# Patient Record
Sex: Female | Born: 1960 | Race: White | Hispanic: No | Smoking: Never smoker
Health system: Southern US, Community
[De-identification: ages and names within clinical notes are randomized; demographics above are authoritative.]

## PROBLEM LIST (undated history)

## (undated) DIAGNOSIS — I10 Essential (primary) hypertension: Secondary | ICD-10-CM

## (undated) HISTORY — DX: Essential (primary) hypertension: I10

## (undated) HISTORY — PX: HERNIA REPAIR: SHX51

---

## 2001-03-06 ENCOUNTER — Ambulatory Visit (HOSPITAL_COMMUNITY): Admission: RE | Admit: 2001-03-06 | Discharge: 2001-03-06 | Payer: Self-pay | Admitting: *Deleted

## 2005-09-13 ENCOUNTER — Encounter: Admission: RE | Admit: 2005-09-13 | Discharge: 2005-09-13 | Payer: Self-pay | Admitting: Obstetrics & Gynecology

## 2005-09-24 ENCOUNTER — Emergency Department (HOSPITAL_COMMUNITY): Admission: EM | Admit: 2005-09-24 | Discharge: 2005-09-24 | Payer: Self-pay | Admitting: Emergency Medicine

## 2006-11-05 IMAGING — US US TRANSVAGINAL NON-OB
1 series · 14 of 25 positions shown · non-contrast
Comparison: none

CLINICAL DATA: Dysmenorrhea and menorrhagia.
 TRANSABDOMINAL AND TRANSVAGINAL PELVIC ULTRASOUND:
TECHNIQUE: Both transabdominal and transvaginal ultrasound examinations of the pelvis were performed, including evaluation of the uterus, ovaries, adnexal regions, and pelvic cul-de-sac.
 The uterus measures 9.9 x 4.5 x 5.7 cm.  Normal endometrial stripe measuring 6.8 mm.  No myometrial abnormalities are seen.  The right ovary measures 4.2 x 1.7 x 2.3 cm.  The left ovary measures 3.1 x 1.7 x 1.7 cm.  No free pelvic fluid collections are seen.

[Series 1: unknown · 0.24mm/px · 14 of 45 slices shown]
[im 1/45]
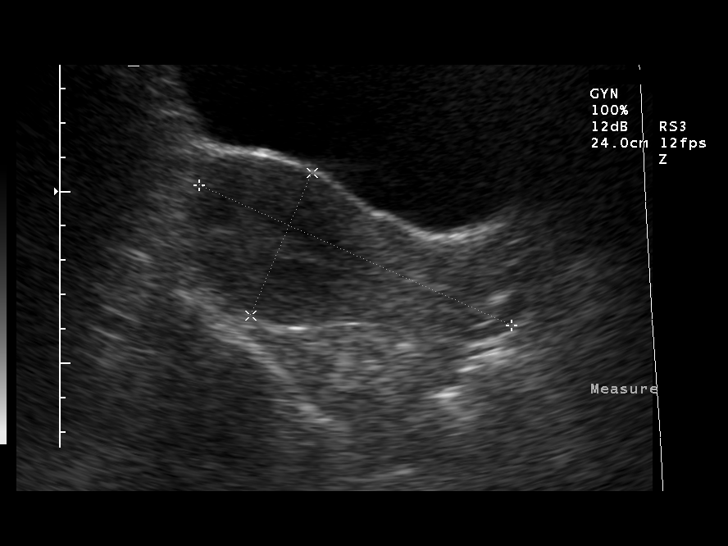
[im 4/45]
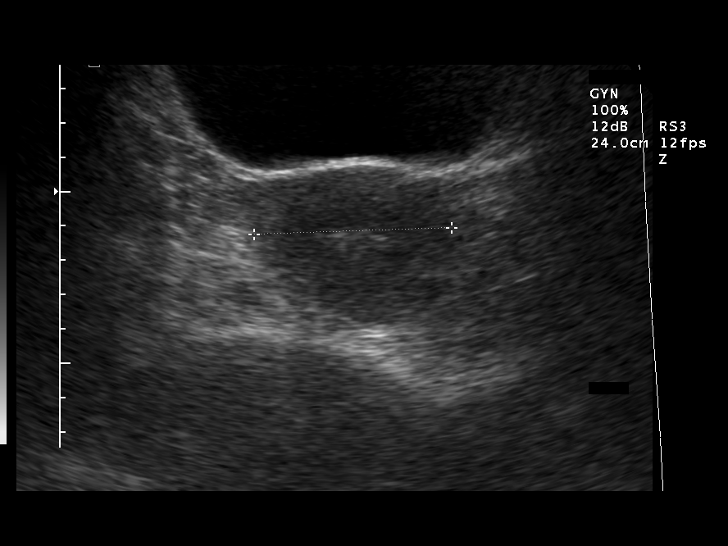
[im 8/45]
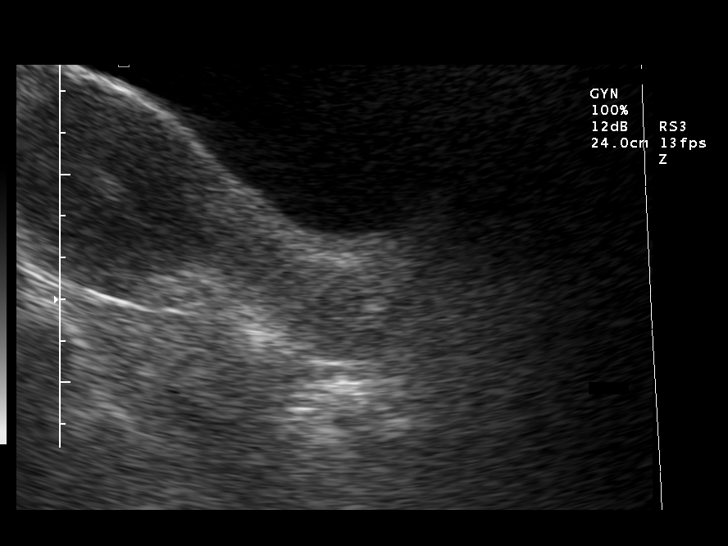
[im 12/45]
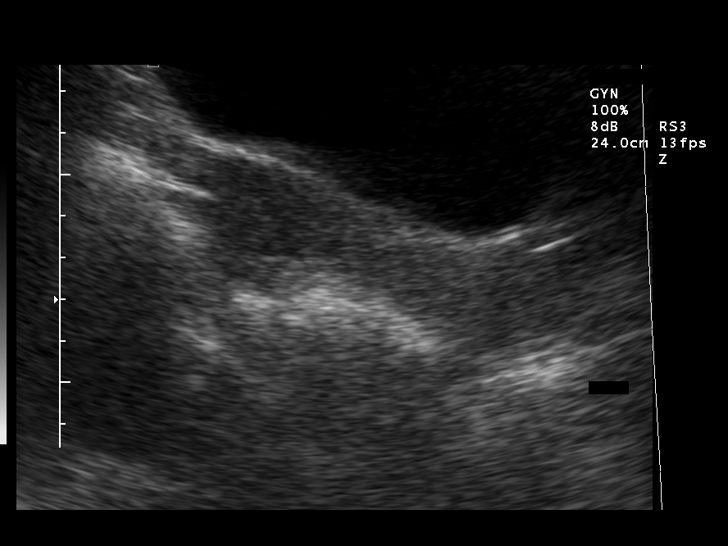
[im 15/45]
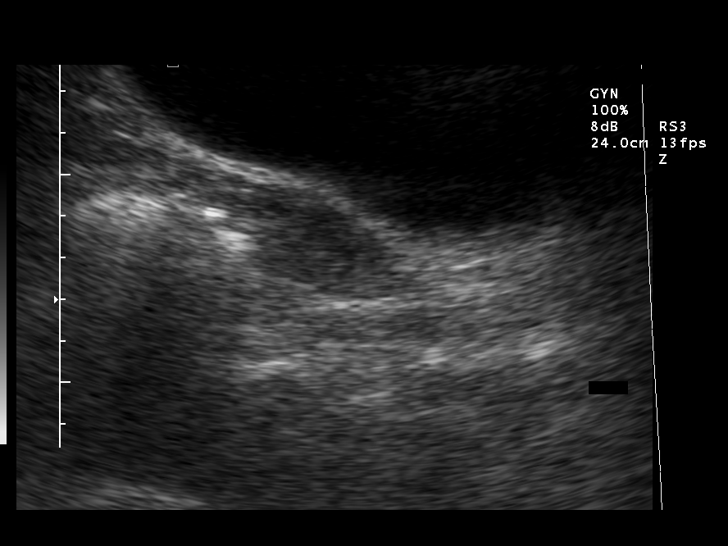
[im 17/45]
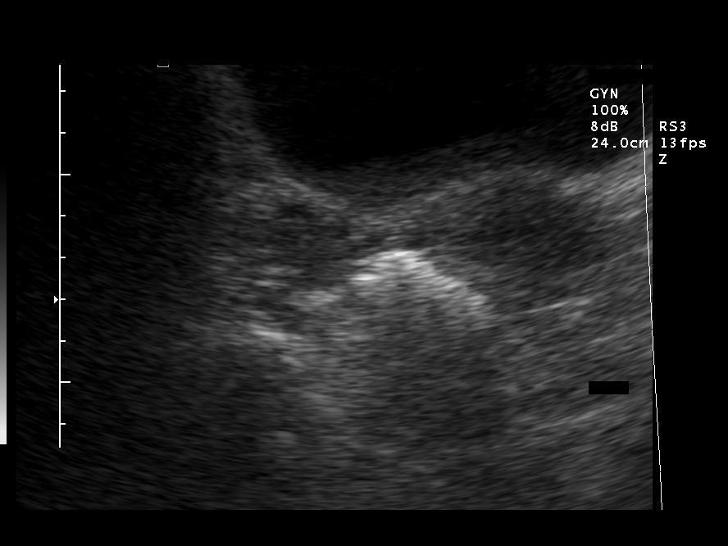
[im 21/45]
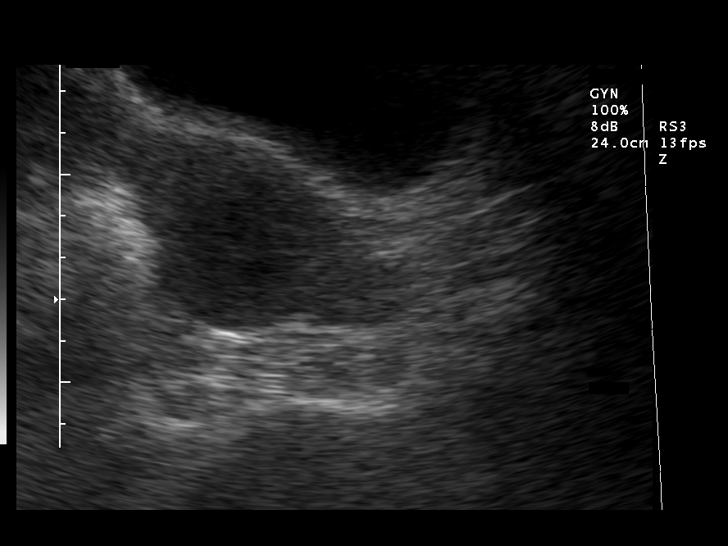
[im 24/45]
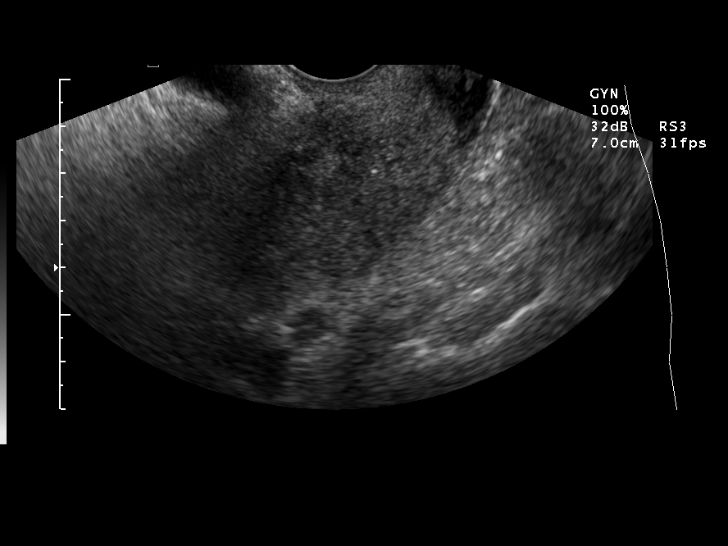
[im 28/45]
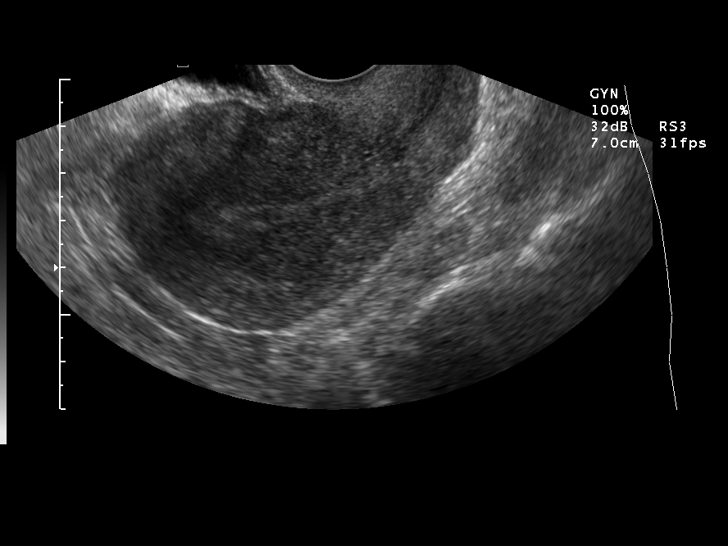
[im 30/45]
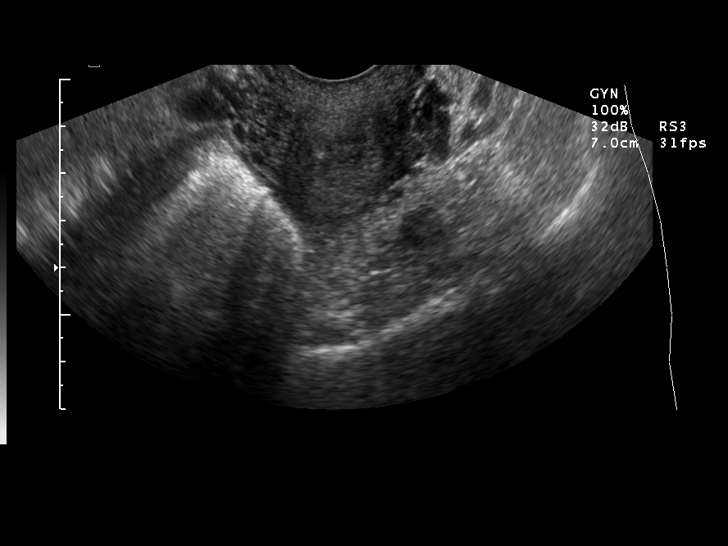
[im 34/45]
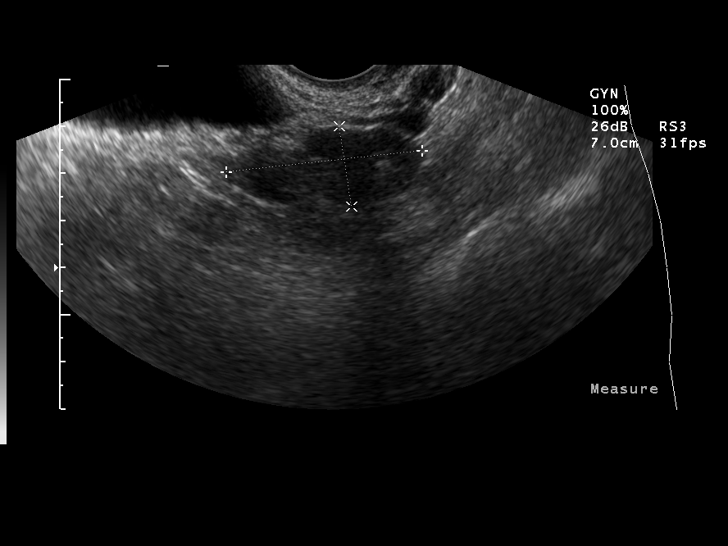
[im 37/45]
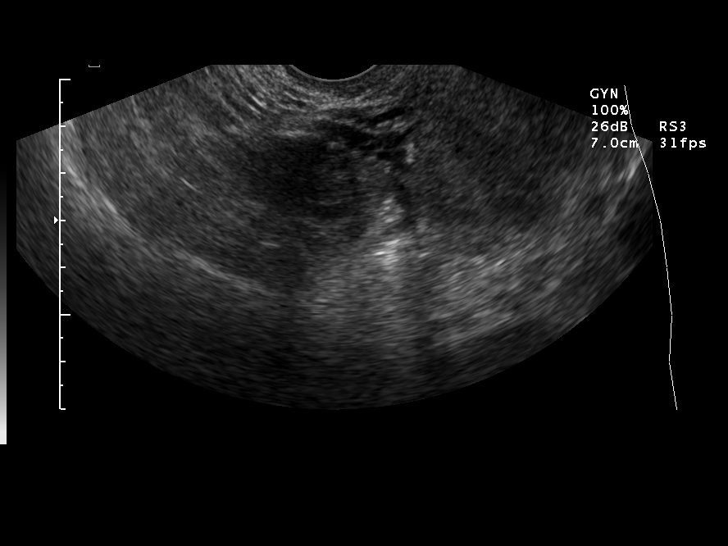
[im 41/45]
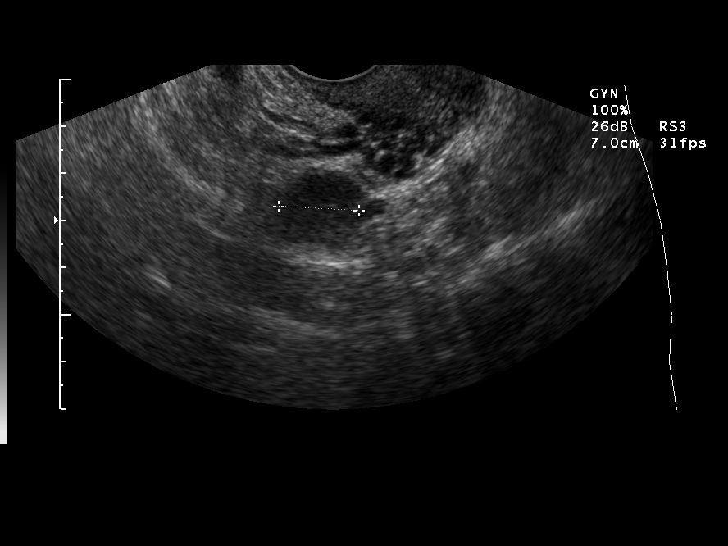
[im 45/45]
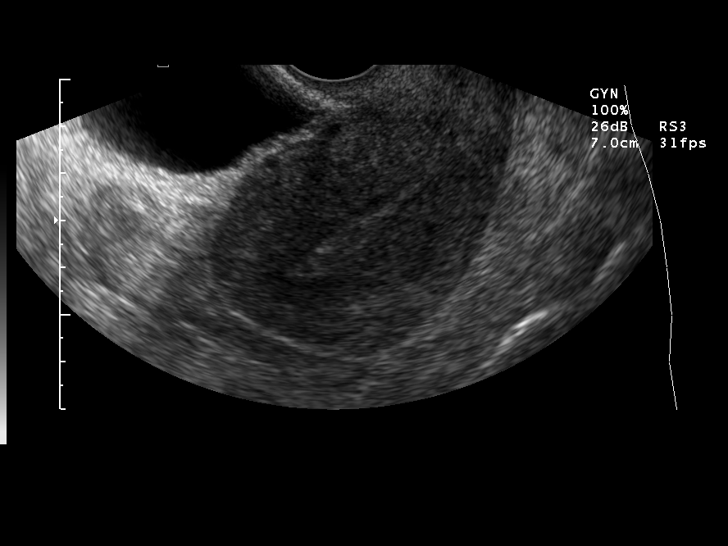

[14 of 25 positions shown; findings below may reference images not displayed]

IMPRESSION: 1.  Unremarkable pelvic ultrasound examination.

## 2012-09-18 ENCOUNTER — Ambulatory Visit: Payer: Self-pay | Admitting: Emergency Medicine

## 2012-09-18 VITALS — BP 128/86 | HR 83 | Temp 97.6°F | Resp 18 | Ht 63.25 in | Wt 186.0 lb

## 2012-09-18 DIAGNOSIS — I10 Essential (primary) hypertension: Secondary | ICD-10-CM | POA: Insufficient documentation

## 2012-09-18 MED ORDER — LISINOPRIL 20 MG PO TABS: 20.0000 mg | ORAL_TABLET | Freq: Every day | ORAL | Status: DC

## 2012-09-18 MED ORDER — HYDROCHLOROTHIAZIDE 25 MG PO TABS: 25.0000 mg | ORAL_TABLET | Freq: Every day | ORAL | Status: DC

## 2012-09-18 MED ORDER — CYCLOBENZAPRINE HCL 10 MG PO TABS: 10.0000 mg | ORAL_TABLET | Freq: Three times a day (TID) | ORAL | Status: DC | PRN

## 2012-09-18 NOTE — Progress Notes (Signed)
[facility] [address] [place] [address] [phone]  Date:  09/18/2012   Name:  Tara Macdonald   DOB:  1961/07/06   MRN:  [id number]  PCP:  No primary provider on file.    Chief Complaint: Medication Refill   History of Present Illness:  Tara Macdonald is a 51 y.o. very pleasant female patient who presents with the following:  History of hypertension for past year.  Had labs checked 3 months after starting.  Now out of medication and needs refill.  Tolerating medication well with no adverse side effect.  There is no problem list on file for this patient.   History reviewed. No pertinent past medical history.  Past Surgical History  Procedure Date  . Hernia repair     History  Substance Use Topics  . Smoking status: Never Smoker   . Smokeless tobacco: Not on file  . Alcohol Use: Not on file    History reviewed. No pertinent family history.  Allergies  Allergen Reactions  . Sulfa Antibiotics Hives    Medication list has been reviewed and updated.  Current Outpatient Prescriptions on File Prior to Visit  Medication Sig Dispense Refill  . hydrochlorothiazide (HYDRODIURIL) 25 MG tablet Take 25 mg by mouth daily.      Marland Kitchen lisinopril (PRINIVIL,ZESTRIL) 20 MG tablet Take 20 mg by mouth daily.        Review of Systems:  As per HPI, otherwise negative.    Physical Examination: Filed Vitals:   09/18/12 1229  BP: 128/86  Pulse: 83  Temp: 97.6 F (36.4 C)  Resp: 18   Filed Vitals:   09/18/12 1229  Height: 5' 3.25" (1.607 m)  Weight: 186 lb (84.369 kg)   Body mass index is 32.69 kg/(m^2). Ideal Body Weight: Weight in (lb) to have BMI = 25: 142   GEN: WDWN, NAD, Non-toxic, A & O x 3 HEENT: Atraumatic, Normocephalic. Neck supple. No masses, No LAD. Ears and Nose: No external deformity. CV: RRR, No M/G/R. No JVD. No thrill. No extra heart sounds. PULM: CTA B, no wheezes, crackles, rhonchi. No retractions. No resp. distress. No  accessory muscle use. ABD: S, NT, ND, +BS. No rebound. No HSM. EXTR: No c/c/e NEURO Normal gait.  PSYCH: Normally interactive. Conversant. Not depressed or anxious appearing.  Calm demeanor.    Assessment and Plan: hypertension Meds ordered this encounter  Medications  . DISCONTD: lisinopril (PRINIVIL,ZESTRIL) 20 MG tablet    Sig: Take 20 mg by mouth daily.  Marland Kitchen DISCONTD: hydrochlorothiazide (HYDRODIURIL) 25 MG tablet    Sig: Take 25 mg by mouth daily.  Marland Kitchen DISCONTD: cyclobenzaprine (FLEXERIL) 10 MG tablet    Sig: Take 10 mg by mouth 3 (three) times daily as needed.  . cyclobenzaprine (FLEXERIL) 10 MG tablet    Sig: Take 1 tablet (10 mg total) by mouth 3 (three) times daily as needed.    Dispense:  30 tablet    Refill:  1  . hydrochlorothiazide (HYDRODIURIL) 25 MG tablet    Sig: Take 1 tablet (25 mg total) by mouth daily.    Dispense:  90 tablet    Refill:  3  . lisinopril (PRINIVIL,ZESTRIL) 20 MG tablet    Sig: Take 1 tablet (20 mg total) by mouth daily.    Dispense:  90 tablet    Refill:  3   I have reviewed and agree with documentation. Robert P. Merla Riches, M.D.  Carmelina Dane, MD

## 2013-06-17 ENCOUNTER — Other Ambulatory Visit: Payer: Self-pay | Admitting: Emergency Medicine

## 2013-06-20 ENCOUNTER — Other Ambulatory Visit: Payer: Self-pay | Admitting: Emergency Medicine

## 2013-09-09 ENCOUNTER — Ambulatory Visit: Payer: Self-pay | Admitting: Family Medicine

## 2013-09-09 ENCOUNTER — Encounter: Payer: Self-pay | Admitting: Family Medicine

## 2013-09-09 VITALS — BP 130/92 | HR 97 | Temp 98.4°F | Resp 20 | Ht 64.0 in | Wt 188.2 lb

## 2013-09-09 DIAGNOSIS — E669 Obesity, unspecified: Secondary | ICD-10-CM | POA: Insufficient documentation

## 2013-09-09 DIAGNOSIS — I10 Essential (primary) hypertension: Secondary | ICD-10-CM

## 2013-09-09 LAB — T4, FREE: Free T4: 0.99 ng/dL (ref 0.80–1.80)

## 2013-09-09 LAB — CBC WITH DIFFERENTIAL/PLATELET
Basophils Absolute: 0.1 10*3/uL (ref 0.0–0.1)
Basophils Relative: 1 % (ref 0–1)
Eosinophils Absolute: 0.3 10*3/uL (ref 0.0–0.7)
Eosinophils Relative: 4 % (ref 0–5)
HCT: 40.5 % (ref 36.0–46.0)
Hemoglobin: 14.2 g/dL (ref 12.0–15.0)
Lymphocytes Relative: 30 % (ref 12–46)
Lymphs Abs: 2.3 10*3/uL (ref 0.7–4.0)
MCH: 28.9 pg (ref 26.0–34.0)
MCHC: 35.1 g/dL (ref 30.0–36.0)
MCV: 82.5 fL (ref 78.0–100.0)
Monocytes Absolute: 0.7 10*3/uL (ref 0.1–1.0)
Monocytes Relative: 10 % (ref 3–12)
Neutro Abs: 4.1 10*3/uL (ref 1.7–7.7)
Neutrophils Relative %: 55 % (ref 43–77)
Platelets: 281 10*3/uL (ref 150–400)
RBC: 4.91 MIL/uL (ref 3.87–5.11)
RDW: 13.9 % (ref 11.5–15.5)
WBC: 7.5 10*3/uL (ref 4.0–10.5)

## 2013-09-09 LAB — COMPREHENSIVE METABOLIC PANEL
ALT: 12 U/L (ref 0–35)
AST: 13 U/L (ref 0–37)
Albumin: 4 g/dL (ref 3.5–5.2)
Alkaline Phosphatase: 65 U/L (ref 39–117)
BUN: 17 mg/dL (ref 6–23)
CO2: 28 mEq/L (ref 19–32)
Calcium: 9.4 mg/dL (ref 8.4–10.5)
Chloride: 102 mEq/L (ref 96–112)
Creat: 0.71 mg/dL (ref 0.50–1.10)
Glucose, Bld: 100 mg/dL — ABNORMAL HIGH (ref 70–99)
Potassium: 3.9 mEq/L (ref 3.5–5.3)
Sodium: 138 mEq/L (ref 135–145)
Total Bilirubin: 0.2 mg/dL — ABNORMAL LOW (ref 0.3–1.2)
Total Protein: 7.1 g/dL (ref 6.0–8.3)

## 2013-09-09 LAB — TSH: TSH: 2.401 u[IU]/mL (ref 0.350–4.500)

## 2013-09-09 LAB — T3, FREE: T3, Free: 3.1 pg/mL (ref 2.3–4.2)

## 2013-09-09 MED ORDER — CYCLOBENZAPRINE HCL 10 MG PO TABS: 10.0000 mg | ORAL_TABLET | Freq: Three times a day (TID) | ORAL | Status: DC | PRN

## 2013-09-09 MED ORDER — HYDROCHLOROTHIAZIDE 25 MG PO TABS: 25.0000 mg | ORAL_TABLET | Freq: Every day | ORAL | Status: DC

## 2013-09-09 MED ORDER — VERAPAMIL HCL ER 180 MG PO TBCR: 180.0000 mg | EXTENDED_RELEASE_TABLET | Freq: Every day | ORAL | Status: DC

## 2013-09-09 NOTE — Progress Notes (Addendum)
S:  This 52 y.o. Cauc female is here for HTN follow-up; she has been on Lisinopril for 2+ years, w/ dose as high as high as 40 mg. She also takes the diuretic but thinks that Lisinopril is not the right medicatiom for her. She monitors BP at home and SBP= 140-150, DBP> 90. She has episodes of chest discomfort, not real tightness; this has been evaluated and and pt diagnosed w/ chest muscle strain. 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 She does not sleep well and does not have much opportunity for exercise. She plans to resume exercise program, having done P90X last year w/ some benefit.  Patient Active Problem List   Diagnosis Date Noted  . Obesity, unspecified 09/09/2013  . Essential hypertension, benign 09/18/2012   PMHx, Soc Hx and Fam Hx reviewed.   ROS: As per HPI; negative for diaphoresis, vision disturbance, CP or palpitations, SOB or DOE, cough, myalgias, HA, dizziness, tremor, weakness, numbness or syncope.  O: Filed Vitals:   09/09/13 1323  BP: 130/92  Pulse: 97  Temp: 98.4 F (36.9 C)  Resp: 20   GEN: In NAD; WN,WD. HENT: [place]/AT; EOMI w/ clear conj/sclerae. EACs/nose/ oroph unremarkable. NECK: Supple w/o LAN or TMG. COR: RRR. PMI not displaced. Normal S1 and S2. No m/g/r. LUNGS: CTA; normal resp rate and effort. SKIN: W&D; intact w/o erythema, rashes, jaundice or pallor. MS: FROM; no c/c/e. No joint deformities or muscle atrophy. NEURO: A&O x 3; CNs intact. Nonfocal.   ECG: Normal sinus rhythm; no ST-TW changes and no ectopy.   A/P: Unspecified essential hypertension - Medication change per pt request. Paper chart notes DX: [place] HTN.  Will discontinue Lisinopril and continue diuretic. Trial : Verapamil ER 180 mg 1 tab daily.  Monitor BP at home. Plan: CBC with Differential, Comprehensive metabolic panel, T3, free, T4, free, TSH, EKG 12-Lead  Obesity, unspecified-  Encouraged weight loss w/ regular physical activity  and improved sleep hygiene.  Meds ordered this encounter  Medications  . verapamil (CALAN-SR) 180 MG CR tablet    Sig: Take 1 tablet (180 mg total) by mouth at bedtime.    Dispense:  30 tablet    Refill:  3  . hydrochlorothiazide (HYDRODIURIL) 25 MG tablet    Sig: Take 1 tablet (25 mg total) by mouth daily.    Dispense:  90 tablet    Refill:  3  . cyclobenzaprine (FLEXERIL) 10 MG tablet    Sig: Take 1 tablet (10 mg total) by mouth 3 (three) times daily as needed for muscle spasms.    Dispense:  30 tablet    Refill:  1

## 2013-09-09 NOTE — Patient Instructions (Signed)
Hypertension As your heart beats, it forces blood through your arteries. This force is your blood pressure. If the pressure is too high, it is called hypertension (HTN) or high blood pressure. HTN is dangerous because you may have it and not know it. High blood pressure may mean that your heart has to work harder to pump blood. Your arteries may be narrow or stiff. The extra work puts you at risk for heart disease, stroke, and other problems.  Blood pressure consists of two numbers, a higher number over a lower, 110/72, for example. It is stated as "110 over 72." The ideal is below 120 for the top number (systolic) and under 80 for the bottom (diastolic). Write down your blood pressure today. You should pay close attention to your blood pressure if you have certain conditions such as:  Heart failure.  Prior heart attack.  Diabetes  Chronic kidney disease.  Prior stroke.  Multiple risk factors for heart disease. To see if you have HTN, your blood pressure should be measured while you are seated with your arm held at the level of the heart. It should be measured at least twice. A one-time elevated blood pressure reading (especially in the Emergency Department) does not mean that you need treatment. There may be conditions in which the blood pressure is different between your right and left arms. It is important to see your caregiver soon for a recheck. Most people have essential hypertension which means that there is not a specific cause. This type of high blood pressure may be lowered by changing lifestyle factors such as:  Stress.  Smoking.  Lack of exercise.  Excessive weight.  Drug/tobacco/alcohol use.  Eating less salt. Most people do not have symptoms from high blood pressure until it has caused damage to the body. Effective treatment can often prevent, delay or reduce that damage. TREATMENT  When a cause has been identified, treatment for high blood pressure is directed at the  cause. There are a large number of medications to treat HTN. These fall into several categories, and your caregiver will help you select the medicines that are best for you. Medications may have side effects. You should review side effects with your caregiver. If your blood pressure stays high after you have made lifestyle changes or started on medicines,   Your medication(s) may need to be changed.  Other problems may need to be addressed.  Be certain you understand your prescriptions, and know how and when to take your medicine.  Be sure to follow up with your caregiver within the time frame advised (usually within two weeks) to have your blood pressure rechecked and to review your medications.  If you are taking more than one medicine to lower your blood pressure, make sure you know how and at what times they should be taken. Taking two medicines at the same time can result in blood pressure that is too low. SEEK IMMEDIATE MEDICAL CARE IF:  You develop a severe headache, blurred or changing vision, or confusion.  You have unusual weakness or numbness, or a faint feeling.  You have severe chest or abdominal pain, vomiting, or breathing problems. MAKE SURE YOU:   Understand these instructions.  Will watch your condition.  Will get help right away if you are not doing well or get worse. Document Released: 11/13/2005 Document Revised: 02/05/2012 Document Reviewed: 07/03/2008 [facility] Patient Information 2014 [place], [place].   I have prescribed a medication that is a CCB (calcium channel blocker). The generic  is Verapamil ER 180 mg  1 tablet daily. Continue to take the diuretic. Check your BP at home at leas daily until we are certain that this is the right medication and dose for you. Your EKG is normal. I will see you again in the clinic in 2 months, sooner if you have any problems with the medication ro new symptoms surface.

## 2013-09-11 ENCOUNTER — Telehealth: Payer: Self-pay

## 2013-09-11 NOTE — Telephone Encounter (Signed)
Phone call- BP medication was changed 2 days ago; pt felt Lisinopril was not helpful so it was discontinued. Calan SR (verapamil 180 mg 1 tab daily) was started yesterday morning. Pt reports onset frontal HA within 1-2 hours of taking verapamil. BP was 150-160/80-100. BP normalized if she laid down but HA persisted. Pt does not have numbness, diplopia, slurred speech or CP or tightness. She took verapamil again this morning w/ same symptoms (HA). She has a few Lisinopril 20 mg tablets so she will switch back to that medication; she is instructed to take 1/2 tablet tonight and 1/2 tablet in the morning and monitor her BP. I will check back w/ her tomorrow evening. Pt advised to seek care in ED if symptoms worsen.

## 2013-09-11 NOTE — Telephone Encounter (Signed)
Please advise 

## 2013-09-11 NOTE — Telephone Encounter (Signed)
Patient calling to report that she has been having a severe headache from the past two days. She was given a new medication hydrochlorothiazide (HYDRODIURIL) 25 MG tablet from Dr. Audria Nine on her appointment recently. She wants to speak to someone clinical for some rx advice and she requests that she would like help, whether to continue this medication or try something new. Please advise.   Best number is: [phone]

## 2013-09-11 NOTE — Progress Notes (Signed)
Quick Note:  Please advise pt regarding following labs...  Labs results are all normal; blood sugar is 1 point above normal. Thyroid gland function is normal.   Focus on better nutrition, staying active and gradual weight loss. Reducing your weight by 20 pounds can greatly reduce your risk of developing Diabetes in the future. This weight loss may take 6- 12 months.  Coy to pt. ______

## 2013-09-12 MED ORDER — LISINOPRIL 20 MG PO TABS: 20.0000 mg | ORAL_TABLET | Freq: Every day | ORAL | Status: DC

## 2013-09-12 NOTE — Telephone Encounter (Signed)
I called pt this evening to see if HA resolved off verapamil. She had much better day today and BP is "normal". Pt is stressed w/ adult son who requires full time care and who does not sleep well. Thus pt not sleeping well; she plans to resume exercising. She does drink regular coffee every day. No other sources of caffeine. She will follow-up in December as scheduled. I advised her of proper way to dispose of unused medication.

## 2014-01-27 ENCOUNTER — Other Ambulatory Visit: Payer: Self-pay | Admitting: Family Medicine

## 2014-08-20 ENCOUNTER — Other Ambulatory Visit: Payer: Self-pay | Admitting: Family Medicine

## 2014-08-24 ENCOUNTER — Telehealth: Payer: Self-pay

## 2014-08-24 MED ORDER — HYDROCHLOROTHIAZIDE 25 MG PO TABS: ORAL_TABLET | ORAL | Status: DC

## 2014-08-24 MED ORDER — LISINOPRIL 20 MG PO TABS: ORAL_TABLET | ORAL | Status: DC

## 2014-08-24 NOTE — Telephone Encounter (Signed)
Sent in rxs to [facility]. LM to advise pt and LM pt needs to rtn call and schedule an appt.

## 2014-08-24 NOTE — Telephone Encounter (Signed)
Patient states she lost her prescription for "Lisinipril" and "hydrochlorothiozide". Per patient she put it in her childs stroller and now cant find it. Requesting it to be called in again to [facility] pharmacy [address] in [place]. Patients call back number is [phone]

## 2014-08-24 NOTE — Addendum Note (Signed)
Addended by: Elease Etienne A on: 08/24/2014 04:12 PM   Modules accepted: Orders

## 2014-08-24 NOTE — Telephone Encounter (Signed)
Patient states she lost her prescription for "Lisinipril" and "hydrochlorothiozide". Per patient she put it in her childs stroller and now cant find it. Requesting it to be called in again to [facility] pharmacy [address] in [place]. Patients call back number is [phone] °

## 2014-08-27 NOTE — Telephone Encounter (Signed)
LM for pt to rtn call if she did not receive her prescription replacements and to remind her she needs an OV

## 2014-11-14 ENCOUNTER — Ambulatory Visit (INDEPENDENT_AMBULATORY_CARE_PROVIDER_SITE_OTHER): Payer: Self-pay | Admitting: Family Medicine

## 2014-11-14 VITALS — BP 132/78 | HR 91 | Temp 98.3°F | Resp 18 | Ht 63.25 in | Wt 181.4 lb

## 2014-11-14 DIAGNOSIS — I159 Secondary hypertension, unspecified: Secondary | ICD-10-CM

## 2014-11-14 DIAGNOSIS — Z23 Encounter for immunization: Secondary | ICD-10-CM

## 2014-11-14 DIAGNOSIS — M62838 Other muscle spasm: Secondary | ICD-10-CM

## 2014-11-14 MED ORDER — CYCLOBENZAPRINE HCL 10 MG PO TABS: 10.0000 mg | ORAL_TABLET | Freq: Three times a day (TID) | ORAL | Status: DC | PRN

## 2014-11-14 MED ORDER — HYDROCHLOROTHIAZIDE 25 MG PO TABS: ORAL_TABLET | ORAL | Status: DC

## 2014-11-14 MED ORDER — LISINOPRIL 20 MG PO TABS: ORAL_TABLET | ORAL | Status: DC

## 2014-11-14 NOTE — Progress Notes (Signed)
Subjective: 53 year old lady who is here for a regular visit. It is been over a year since she was here. She is not good any other doctors regularly. She went somewhere acutely elsewhere gave her a brief refill that is how she made it this long. She prefers to have 2 different blood pressure medicines, lisinopril and hydrochlorothiazide. She wants to be able to not take the hydrochlorothiazide when she has to be out with her handicapped son. She says she can't afford to be stopping to go to the bathroom. She has a son with cerebral palsy she attends to full-time. She does not work. She has never taken flu shots. She has never had a colonoscopy. It has been several years since she's had a mammogram. She has no other major physical complaints. No chest pains or breathing issues. After lifting her son a lot sometimes she gets muscle spasms and occasionally takes a cyclobenzaprine.  Objective: Pleasant lady, alert and oriented, moderately overweight. Her TMs are partially occluded by cerumen. Eyes PERRLA. Throat clear. Neck supple without nodes or thyromegaly. No carotid bruits. Chest clear to auscultation. Heart regular without murmurs. Abdomen soft and nontender.  Assessment: Hypertension History of muscle spasms Need of colonoscopy Need of mammogram  Plan: Advise mammogram, colonoscopy, and flu shot. Patient declined the flu shot. She will think about the others. If she decides she is ready for a colonoscopy we can make a referral for her.  Return in one year or as needed.

## 2014-11-14 NOTE — Patient Instructions (Signed)
Take same blood pressure medication. Continue to monitor your blood pressure elsewhere intermittently to make sure you are maintaining adequate control with a pressure usually below 140/90. Ideally is good if the blood pressure is down toward 120/70.  Try to keep your weight down, avoid excessive salt, and get regular exercise  Consider getting a mammogram sometime in the near future  Consider getting a colonoscopy. When you determined that you would be able to a range to get the time to go to a gastroenterologist these contact our practice and tell them that I said we would refer you to Dr. Elnoria HowardHung and Lore[facility].    Consider getting a flu shot  Return in one year or as needed.

## 2015-03-09 ENCOUNTER — Other Ambulatory Visit: Payer: Self-pay | Admitting: Family Medicine

## 2015-06-30 ENCOUNTER — Other Ambulatory Visit: Payer: Self-pay | Admitting: Family Medicine

## 2015-10-19 ENCOUNTER — Other Ambulatory Visit: Payer: Self-pay | Admitting: Family Medicine

## 2015-11-26 ENCOUNTER — Ambulatory Visit (INDEPENDENT_AMBULATORY_CARE_PROVIDER_SITE_OTHER): Payer: Self-pay | Admitting: Family Medicine

## 2015-11-26 VITALS — BP 130/80 | HR 89 | Temp 98.1°F | Resp 17 | Ht 63.25 in | Wt 198.0 lb

## 2015-11-26 DIAGNOSIS — M549 Dorsalgia, unspecified: Secondary | ICD-10-CM

## 2015-11-26 DIAGNOSIS — I10 Essential (primary) hypertension: Secondary | ICD-10-CM

## 2015-11-26 MED ORDER — CYCLOBENZAPRINE HCL 10 MG PO TABS: 10.0000 mg | ORAL_TABLET | Freq: Three times a day (TID) | ORAL | Status: DC | PRN

## 2015-11-26 MED ORDER — LISINOPRIL 20 MG PO TABS: ORAL_TABLET | ORAL | Status: AC

## 2015-11-26 MED ORDER — HYDROCHLOROTHIAZIDE 25 MG PO TABS: ORAL_TABLET | ORAL | Status: AC

## 2015-11-26 MED ORDER — HYDROCODONE-ACETAMINOPHEN 5-325 MG PO TABS: 1.0000 | ORAL_TABLET | Freq: Four times a day (QID) | ORAL | Status: AC | PRN

## 2015-11-26 NOTE — Progress Notes (Signed)
Subjective:    Patient ID: Tara AnchorsBetty C Macdonald, female    DOB: 12-01-1960, 54 y.o.   MRN: [id number] By signing my name below, I, Littie Deedsichard Sun, attest that this documentation has been prepared under the direction and in the presence of Norberto SorensonEva Shaw, MD.  Electronically Signed: Littie Deedsichard Sun, Medical Scribe. 11/26/2015. 5:20 PM.  Chief Complaint  Patient presents with  . Medication Refill    htcz,lisinopril, flexeril  . Hypertension    elevated at triage, 130/80 when placed in room    HPI HPI Comments: Tara Macdonald is a 54 y.o. female with a history of hypertension who presents to the [facility] for a medication refill for HCTZ, lisinopril, and flexeril.   Hypertension: Patient ran out of her blood pressure medications last week. She has been taking medications for a while and has not had any problems with them. She had not been taking her blood pressure at home because her machine broke. Patient notes that her blood pressure was better controlled when she was exercising regularly, but she has not been exercising lately. She had been doing exercises such as P90X and 21 Day Fix. She denies cough and urinary problems.  Back pain: Patient has been having back pain primarily in her lower back, which she attributes to taking care of and lifting her 52 year old son, who has cerebral palsy. She also believes she has been having more problems with her back since she stopped exercising. The pain sometimes radiates down her left leg, which she believes is due to sciatica. She usually takes flexeril at night for this. She notes that she sometimes has difficulty sleeping due to the pain and is requesting hydrocodone to help her sleep. Patient states she did receive some hydrocodone from another urgent care. She denies weakness, numbness, and bowel or bladder problems. She still has menstrual periods.  Health maintenance: Patient has not had a mammogram recently.   Past Medical History    Diagnosis Date  . Hypertension    Past Surgical History  Procedure Laterality Date  . Hernia repair     No current outpatient prescriptions on file prior to visit.   No current facility-administered medications on file prior to visit.   Allergies  Allergen Reactions  . Sulfa Antibiotics Hives   No family history on file. Social History   Social History  . Marital Status: Legally Separated    Spouse Name: N/A  . Number of Children: N/A  . Years of Education: N/A   Social History Main Topics  . Smoking status: Never Smoker   . Smokeless tobacco: None  . Alcohol Use: Yes  . Drug Use: No  . Sexual Activity: Not Asked   Other Topics Concern  . None   Social History Narrative     Review of Systems  Respiratory: Negative for cough.   Genitourinary: Negative.   Musculoskeletal: Positive for back pain.       Objective:  BP 130/80 mmHg  Pulse 89  Temp(Src) 98.1 F (36.7 C) (Oral)  Resp 17  Ht 5' 3.25" (1.607 m)  Wt 198 lb (89.812 kg)  BMI 34.78 kg/m2  SpO2 98%  LMP 11/21/2015  Physical Exam  Constitutional: She is oriented to person, place, and time. She appears well-developed and well-nourished. No distress.  HENT:  Head: Normocephalic and atraumatic.  Mouth/Throat: Oropharynx is clear and moist. No oropharyngeal exudate.  Eyes: Pupils are equal, round, and reactive to light.  Neck: Neck supple. No thyromegaly present.  Normal thyroid.  Cardiovascular: Normal rate, regular rhythm, S1 normal, S2 normal and normal heart sounds.  Exam reveals no gallop and no friction rub.   No murmur heard. Pulmonary/Chest: Effort normal and breath sounds normal. No respiratory distress.  Clear to auscultation bilaterally.   Musculoskeletal: She exhibits no edema.  Neurological: She is alert and oriented to person, place, and time. No cranial nerve deficit.  Skin: Skin is warm and dry. No rash noted.  Psychiatric: She has a normal mood and affect. Her behavior is normal.   Nursing note and vitals reviewed.         Assessment & Plan:   1. Essential hypertension, benign   2. Bilateral back pain, unspecified location   Advised having bmp while on medication next yr.  Pt w/o health ins so very concious of cost. Chronic MSK back pain from heavy lifting taking care of her 34 yo son with CP so refilled flexeril and few prn hydrocodone per pt request.  Needs OV for any reflil of hydrocodone.   Today I have utilized the [place] Controlled Substance Registry's online query to confirm compliance regarding the patient's narcotic pain medications. My review reveals appropriate prescription fills and that [facility] is the sole provider of these medications. Rechecks will occur regularly and the patient is aware of our use of the system.  PREVENTATIVE Care: Recommend pt contact [facility] or [facility] about scholarship for mam and pap. # given.  Meds ordered this encounter  Medications  . cyclobenzaprine (FLEXERIL) 10 MG tablet    Sig: Take 1 tablet (10 mg total) by mouth 3 (three) times daily as needed.    Dispense:  30 tablet    Refill:  5  . hydrochlorothiazide (HYDRODIURIL) 25 MG tablet    Sig: TAKE ONE TABLET BY MOUTH ONCE DAILY FOR BLOOD PRESSURE    Dispense:  90 tablet    Refill:  3  . lisinopril (PRINIVIL,ZESTRIL) 20 MG tablet    Sig: TAKE ONE TABLET BY MOUTH ONCE DAILY FOR BLOOD PRESSURE    Dispense:  90 tablet    Refill:  3  . HYDROcodone-acetaminophen (NORCO/VICODIN) 5-325 MG tablet    Sig: Take 1 tablet by mouth every 6 (six) hours as needed for moderate pain.    Dispense:  30 tablet    Refill:  0    I personally performed the services described in this documentation, which was scribed in my presence. The recorded information has been reviewed and considered, and addended by me as needed.  Norberto Sorenson, MD MPH

## 2015-11-26 NOTE — Patient Instructions (Addendum)
You need to schedule you mammogram and pap smear.  Please call thebreast center at [facility] at [phone] to ask about scholarships for these and to schedule I would recommend getting a basic metabolic panel next year while you are still on your blood pressure pills to ensure they are not causing kidney irritation.  [facility] Policy for Prescribing Controlled Substances (Revised 09/2012) 1. Prescriptions for controlled substances will be filled by ONE provider at [facility] with whom you have established and developed a plan for your care, including follow-up. 2. You are encouraged to schedule an appointment with your prescriber at our appointment center for follow-up visits whenever possible. 3. If you request a prescription for the controlled substance while at [facility] for an acute problem (with someone other than your regular prescriber), you MAY be given a ONE-TIME prescription for a 30-day supply of the controlled substance, to allow time for you to return to see your regular prescriber for additional prescriptions.  Managing Your High Blood Pressure Blood pressure is a measurement of how forceful your blood is pressing against the walls of the arteries. Arteries are muscular tubes within the circulatory system. Blood pressure does not stay the same. Blood pressure rises when you are active, excited, or nervous; and it lowers during sleep and relaxation. If the numbers measuring your blood pressure stay above normal most of the time, you are at risk for health problems. High blood pressure (hypertension) is a long-term (chronic) condition in which blood pressure is elevated. A blood pressure reading is recorded as two numbers, such as 120 over 80 (or 120/80). The first, higher number is called the systolic pressure. It is a measure of the pressure in your arteries as the heart beats. The second, lower number is called the diastolic pressure. It is a measure of the pressure in your arteries as the heart  relaxes between beats.  Keeping your blood pressure in a normal range is important to your overall health and prevention of health problems, such as heart disease and stroke. When your blood pressure is uncontrolled, your heart has to work harder than normal. High blood pressure is a very common condition in adults because blood pressure tends to rise with age. Men and women are equally likely to have hypertension but at different times in life. Before age 36, men are more likely to have hypertension. After 52 years of age, women are more likely to have it. Hypertension is especially common in African Americans. This condition often has no signs or symptoms. The cause of the condition is usually not known. Your caregiver can help you come up with a plan to keep your blood pressure in a normal, healthy range. BLOOD PRESSURE STAGES Blood pressure is classified into four stages: normal, prehypertension, stage 1, and stage 2. Your blood pressure reading will be used to determine what type of treatment, if any, is necessary. Appropriate treatment options are tied to these four stages:  Normal  Systolic pressure (mm Hg): below 120.  Diastolic pressure (mm Hg): below 80. Prehypertension  Systolic pressure (mm Hg): 120 to 139.  Diastolic pressure (mm Hg): 80 to 89. Stage1  Systolic pressure (mm Hg): 140 to 159.  Diastolic pressure (mm Hg): 90 to 99. Stage2  Systolic pressure (mm Hg): 160 or above.  Diastolic pressure (mm Hg): 100 or above. RISKS RELATED TO HIGH BLOOD PRESSURE Managing your blood pressure is an important responsibility. Uncontrolled high blood pressure can lead to:  A heart attack.  A stroke.  A weakened blood vessel (aneurysm).  Heart failure.  Kidney damage.  Eye damage.  Metabolic syndrome.  Memory and concentration problems. HOW TO MANAGE YOUR BLOOD PRESSURE Blood pressure can be managed effectively with lifestyle changes and medicines (if needed). Your  caregiver will help you come up with a plan to bring your blood pressure within a normal range. Your plan should include the following: Education  Read all information provided by your caregivers about how to control blood pressure.  Educate yourself on the latest guidelines and treatment recommendations. New research is always being done to further define the risks and treatments for high blood pressure. Lifestylechanges  Control your weight.  Avoid smoking.  Stay physically active.  Reduce the amount of salt in your diet.  Reduce stress.  Control any chronic conditions, such as high cholesterol or diabetes.  Reduce your alcohol intake. Medicines  Several medicines (antihypertensive medicines) are available, if needed, to bring blood pressure within a normal range. Communication  Review all the medicines you take with your caregiver because there may be side effects or interactions.  Talk with your caregiver about your diet, exercise habits, and other lifestyle factors that may be contributing to high blood pressure.  See your caregiver regularly. Your caregiver can help you create and adjust your plan for managing high blood pressure. RECOMMENDATIONS FOR TREATMENT AND FOLLOW-UP  The following recommendations are based on current guidelines for managing high blood pressure in nonpregnant adults. Use these recommendations to identify the proper follow-up period or treatment option based on your blood pressure reading. You can discuss these options with your caregiver.  Systolic pressure of 120 to 139 or diastolic pressure of 80 to 89: Follow up with your caregiver as directed.  Systolic pressure of 140 to 160 or diastolic pressure of 90 to 100: Follow up with your caregiver within 2 months.  Systolic pressure above 160 or diastolic pressure above 100: Follow up with your caregiver within 1 month.  Systolic pressure above 180 or diastolic pressure above 110: Consider  antihypertensive therapy; follow up with your caregiver within 1 week.  Systolic pressure above 200 or diastolic pressure above 120: Begin antihypertensive therapy; follow up with your caregiver within 1 week.   This information is not intended to replace advice given to you by your health care provider. Make sure you discuss any questions you have with your health care provider.   Document Released: 08/07/2012 Document Reviewed: 08/07/2012 Elsevier Interactive Patient Education 2016 [employer] Potassium Content of Foods Potassium is a mineral found in many foods and drinks. It helps keep fluids and minerals balanced in your body and affects how steadily your heart beats. Potassium also helps control your blood pressure and keep your muscles and nervous system healthy. Certain health conditions and medicines may change the balance of potassium in your body. When this happens, you can help balance your level of potassium through the foods that you do or do not eat. Your health care provider or dietitian may recommend an amount of potassium that you should have each day. The following lists of foods provide the amount of potassium (in parentheses) per serving in each item. HIGH IN POTASSIUM  The following foods and beverages have 200 mg or more of potassium per serving:  Apricots, 2 raw or 5 dry (200 mg).  Artichoke, 1 medium (345 mg).  Avocado, raw,  each (245 mg).  Banana, 1 medium (425 mg).  Beans, lima, or baked beans, canned,  cup (280 mg).  Beans, white, canned,  cup (595 mg).  Beef roast, 3 oz (320 mg).  Beef, ground, 3 oz (270 mg).  Beets, raw or cooked,  cup (260 mg).  Bran muffin, 2 oz (300 mg).  Broccoli,  cup (230 mg).  Brussels sprouts,  cup (250 mg).  Cantaloupe,  cup (215 mg).  Cereal, 100% bran,  cup (200-400 mg).  Cheeseburger, single, fast food, 1 each (225-400 mg).  Chicken, 3 oz (220 mg).  Clams, canned, 3 oz (535 mg).  Crab, 3 oz (225  mg).  Dates, 5 each (270 mg).  Dried beans and peas,  cup (300-475 mg).  Figs, dried, 2 each (260 mg).  Fish: halibut, tuna, cod, snapper, 3 oz (480 mg).  Fish: salmon, [place], swordfish, perch, 3 oz (300 mg).  Fish, tuna, canned 3 oz (200 mg).  [place] fries, fast food, 3 oz (470 mg).  Granola with fruit and nuts,  cup (200 mg).  Grapefruit juice,  cup (200 mg).  Greens, beet,  cup (655 mg).  Honeydew melon,  cup (200 mg).  Kale, raw, 1 cup (300 mg).  Kiwi, 1 medium (240 mg).  Kohlrabi, rutabaga, parsnips,  cup (280 mg).  Lentils,  cup (365 mg).  Mango, 1 each (325 mg).  Milk, chocolate, 1 cup (420 mg).  Milk: nonfat, low-fat, whole, buttermilk, 1 cup (350-380 mg).  Molasses, 1 Tbsp (295 mg).  Mushrooms,  cup (280) mg.  Nectarine, 1 each (275 mg).  Nuts: almonds, peanuts, hazelnuts, [place], cashew, mixed, 1 oz (200 mg).  Nuts, pistachios, 1 oz (295 mg).  Orange, 1 each (240 mg).  Orange juice,  cup (235 mg).  Papaya, medium,  fruit (390 mg).  Peanut butter, chunky, 2 Tbsp (240 mg).  Peanut butter, smooth, 2 Tbsp (210 mg).  Pear, 1 medium (200 mg).  Pomegranate, 1 whole (400 mg).  Pomegranate juice,  cup (215 mg).  Pork, 3 oz (350 mg).  Potato chips, salted, 1 oz (465 mg).  Potato, baked with skin, 1 medium (925 mg).  Potatoes, boiled,  cup (255 mg).  Potatoes, mashed,  cup (330 mg).  Prune juice,  cup (370 mg).  Prunes, 5 each (305 mg).  Pudding, chocolate,  cup (230 mg).  Pumpkin, canned,  cup (250 mg).  Raisins, seedless,  cup (270 mg).  Seeds, sunflower or pumpkin, 1 oz (240 mg).  Soy milk, 1 cup (300 mg).  Spinach,  cup (420 mg).  Spinach, canned,  cup (370 mg).  Sweet potato, baked with skin, 1 medium (450 mg).  Swiss chard,  cup (480 mg).  Tomato or vegetable juice,  cup (275 mg).  Tomato sauce or puree,  cup (400-550 mg).  Tomato, raw, 1 medium (290 mg).  Tomatoes, canned,  cup  (200-300 mg).  [place], 3 oz (250 mg).  Wheat germ, 1 oz (250 mg).  Winter squash,  cup (250 mg).  Yogurt, plain or fruited, 6 oz (260-435 mg).  Zucchini,  cup (220 mg). MODERATE IN POTASSIUM The following foods and beverages have 50-200 mg of potassium per serving:  Apple, 1 each (150 mg).  Apple juice,  cup (150 mg).  Applesauce,  cup (90 mg).  Apricot nectar,  cup (140 mg).  Asparagus, small spears,  cup or 6 spears (155 mg).  Bagel, cinnamon raisin, 1 each (130 mg).  Bagel, egg or plain, 4 in., 1 each (70 mg).  Beans, green,  cup (90 mg).  Beans, yellow,  cup (190 mg).  Beer, regular, 12 oz (  100 mg).  Beets, canned,  cup (125 mg).  Blackberries,  cup (115 mg).  Blueberries,  cup (60 mg).  Bread, whole wheat, 1 slice (70 mg).  Broccoli, raw,  cup (145 mg).  Cabbage,  cup (150 mg).  Carrots, cooked or raw,  cup (180 mg).  Cauliflower, raw,  cup (150 mg).  Celery, raw,  cup (155 mg).  Cereal, bran flakes, cup (120-150 mg).  Cheese, cottage,  cup (110 mg).  Cherries, 10 each (150 mg).  Chocolate, 1 oz bar (165 mg).  Coffee, brewed 6 oz (90 mg).  Corn,  cup or 1 ear (195 mg).  Cucumbers,  cup (80 mg).  Egg, large, 1 each (60 mg).  Eggplant,  cup (60 mg).  Endive, raw, cup (80 mg).  English muffin, 1 each (65 mg).  Fish, orange roughy, 3 oz (150 mg).  Frankfurter, beef or pork, 1 each (75 mg).  Fruit cocktail,  cup (115 mg).  Grape juice,  cup (170 mg).  Grapefruit,  fruit (175 mg).  Grapes,  cup (155 mg).  Greens: kale, turnip, collard,  cup (110-150 mg).  Ice cream or frozen yogurt, chocolate,  cup (175 mg).  Ice cream or frozen yogurt, vanilla,  cup (120-150 mg).  Lemons, limes, 1 each (80 mg).  Lettuce, all types, 1 cup (100 mg).  Mixed vegetables,  cup (150 mg).  Mushrooms, raw,  cup (110 mg).  Nuts: walnuts, pecans, or macadamia, 1 oz (125 mg).  Oatmeal,  cup (80 mg).  Okra,   cup (110 mg).  Onions, raw,  cup (120 mg).  Peach, 1 each (185 mg).  Peaches, canned,  cup (120 mg).  Pears, canned,  cup (120 mg).  Peas, green, frozen,  cup (90 mg).  Peppers, green,  cup (130 mg).  Peppers, red,  cup (160 mg).  Pineapple juice,  cup (165 mg).  Pineapple, fresh or canned,  cup (100 mg).  Plums, 1 each (105 mg).  Pudding, vanilla,  cup (150 mg).  Raspberries,  cup (90 mg).  Rhubarb,  cup (115 mg).  Rice, wild,  cup (80 mg).  Shrimp, 3 oz (155 mg).  Spinach, raw, 1 cup (170 mg).  Strawberries,  cup (125 mg).  Summer squash  cup (175-200 mg).  Swiss chard, raw, 1 cup (135 mg).  Tangerines, 1 each (140 mg).  Tea, brewed, 6 oz (65 mg).  Turnips,  cup (140 mg).  Watermelon,  cup (85 mg).  Wine, red, table, 5 oz (180 mg).  Wine, white, table, 5 oz (100 mg). LOW IN POTASSIUM The following foods and beverages have less than 50 mg of potassium per serving.  Bread, white, 1 slice (30 mg).  Carbonated beverages, 12 oz (less than 5 mg).  Cheese, 1 oz (20-30 mg).  Cranberries,  cup (45 mg).  Cranberry juice cocktail,  cup (20 mg).  Fats and oils, 1 Tbsp (less than 5 mg).  Hummus, 1 Tbsp (32 mg).  Nectar: papaya, mango, or pear,  cup (35 mg).  Rice, white or brown,  cup (50 mg).  Spaghetti or macaroni,  cup cooked (30 mg).  Tortilla, flour or corn, 1 each (50 mg).  Waffle, 4 in., 1 each (50 mg).  Water chestnuts,  cup (40 mg).   This information is not intended to replace advice given to you by your health care provider. Make sure you discuss any questions you have with your health care provider.   Document Released: 06/27/2005 Document Revised: 11/18/2013 Document Reviewed:  10/10/2013 Elsevier Interactive Patient Education 2016 [employer]  

## 2016-05-05 ENCOUNTER — Other Ambulatory Visit: Payer: Self-pay | Admitting: Family Medicine

## 2016-06-08 ENCOUNTER — Other Ambulatory Visit: Payer: Self-pay | Admitting: Family Medicine
